# Patient Record
Sex: Male | Born: 1945 | Race: White | Hispanic: No | Marital: Single | Smoking: Never smoker
Health system: Southern US, Community
[De-identification: ages and names within clinical notes are randomized; demographics above are authoritative.]

## PROBLEM LIST (undated history)

## (undated) DIAGNOSIS — R202 Paresthesia of skin: Secondary | ICD-10-CM

## (undated) DIAGNOSIS — M1990 Unspecified osteoarthritis, unspecified site: Secondary | ICD-10-CM

## (undated) DIAGNOSIS — K5900 Constipation, unspecified: Secondary | ICD-10-CM

## (undated) DIAGNOSIS — R200 Anesthesia of skin: Secondary | ICD-10-CM

## (undated) DIAGNOSIS — S069XAA Unspecified intracranial injury with loss of consciousness status unknown, initial encounter: Secondary | ICD-10-CM

## (undated) DIAGNOSIS — K219 Gastro-esophageal reflux disease without esophagitis: Secondary | ICD-10-CM

## (undated) DIAGNOSIS — F32A Depression, unspecified: Secondary | ICD-10-CM

## (undated) DIAGNOSIS — W19XXXA Unspecified fall, initial encounter: Secondary | ICD-10-CM

## (undated) DIAGNOSIS — F329 Major depressive disorder, single episode, unspecified: Secondary | ICD-10-CM

## (undated) DIAGNOSIS — J189 Pneumonia, unspecified organism: Secondary | ICD-10-CM

## (undated) DIAGNOSIS — S069X9A Unspecified intracranial injury with loss of consciousness of unspecified duration, initial encounter: Secondary | ICD-10-CM

## (undated) HISTORY — DX: Major depressive disorder, single episode, unspecified: F32.9

## (undated) HISTORY — DX: Paresthesia of skin: R20.0

## (undated) HISTORY — DX: Depression, unspecified: F32.A

## (undated) HISTORY — DX: Unspecified intracranial injury with loss of consciousness of unspecified duration, initial encounter: S06.9X9A

## (undated) HISTORY — DX: Pneumonia, unspecified organism: J18.9

## (undated) HISTORY — DX: Unspecified osteoarthritis, unspecified site: M19.90

## (undated) HISTORY — DX: Unspecified fall, initial encounter: W19.XXXA

## (undated) HISTORY — DX: Constipation, unspecified: K59.00

## (undated) HISTORY — DX: Gastro-esophageal reflux disease without esophagitis: K21.9

## (undated) HISTORY — PX: TOTAL HIP ARTHROPLASTY: SHX124

## (undated) HISTORY — DX: Unspecified intracranial injury with loss of consciousness status unknown, initial encounter: S06.9XAA

## (undated) HISTORY — DX: Anesthesia of skin: R20.2

---

## 2013-01-07 HISTORY — PX: JOINT REPLACEMENT: SHX530

## 2015-01-08 HISTORY — PX: JOINT REPLACEMENT: SHX530

## 2016-02-29 ENCOUNTER — Other Ambulatory Visit: Payer: Self-pay | Admitting: Orthopedic Surgery

## 2016-02-29 DIAGNOSIS — Z01818 Encounter for other preprocedural examination: Secondary | ICD-10-CM

## 2016-03-04 ENCOUNTER — Ambulatory Visit
Admission: RE | Admit: 2016-03-04 | Discharge: 2016-03-04 | Disposition: A | Discharge status: Home / Self Care | Payer: Medicare Other | Source: Ambulatory Visit | Attending: Orthopedic Surgery | Admitting: Orthopedic Surgery

## 2016-03-04 ENCOUNTER — Other Ambulatory Visit: Payer: Self-pay

## 2016-03-04 DIAGNOSIS — Z01818 Encounter for other preprocedural examination: Secondary | ICD-10-CM

## 2016-04-25 LAB — CBC AND DIFFERENTIAL
HEMATOCRIT: 33 % — AB (ref 41–53)
HEMOGLOBIN: 10.6 g/dL — AB (ref 13.5–17.5)
Platelets: 365 10*3/uL (ref 150–399)
WBC: 11.5 10^3/mL

## 2016-04-25 LAB — BASIC METABOLIC PANEL
BUN: 20 mg/dL (ref 4–21)
Creatinine: 1.2 mg/dL (ref 0.6–1.3)
Glucose: 92 mg/dL
Potassium: 5.4 mmol/L — AB (ref 3.4–5.3)
Sodium: 137 mmol/L (ref 137–147)

## 2016-05-09 LAB — BASIC METABOLIC PANEL: CREATININE: 1.3 mg/dL (ref 0.6–1.3)

## 2016-05-10 LAB — POCT INR: INR: 1.3 — AB (ref 0.9–1.1)

## 2016-05-10 LAB — CBC AND DIFFERENTIAL
HEMATOCRIT: 31 % — AB (ref 41–53)
Hemoglobin: 10.2 g/dL — AB (ref 13.5–17.5)

## 2016-05-11 LAB — CBC AND DIFFERENTIAL
HCT: 28 % — AB (ref 41–53)
Hemoglobin: 9.3 g/dL — AB (ref 13.5–17.5)

## 2016-05-11 LAB — POCT INR: INR: 1.6 — AB (ref 0.9–1.1)

## 2016-05-13 ENCOUNTER — Encounter: Payer: Self-pay | Admitting: Internal Medicine

## 2016-05-13 ENCOUNTER — Non-Acute Institutional Stay (SKILLED_NURSING_FACILITY): Payer: Medicare Other | Admitting: Internal Medicine

## 2016-05-13 DIAGNOSIS — N400 Enlarged prostate without lower urinary tract symptoms: Secondary | ICD-10-CM | POA: Diagnosis not present

## 2016-05-13 DIAGNOSIS — R2681 Unsteadiness on feet: Secondary | ICD-10-CM

## 2016-05-13 DIAGNOSIS — Z96649 Presence of unspecified artificial hip joint: Secondary | ICD-10-CM | POA: Diagnosis not present

## 2016-05-13 DIAGNOSIS — S82142A Displaced bicondylar fracture of left tibia, initial encounter for closed fracture: Secondary | ICD-10-CM | POA: Insufficient documentation

## 2016-05-13 DIAGNOSIS — S82142S Displaced bicondylar fracture of left tibia, sequela: Secondary | ICD-10-CM | POA: Diagnosis not present

## 2016-05-13 DIAGNOSIS — D500 Iron deficiency anemia secondary to blood loss (chronic): Secondary | ICD-10-CM

## 2016-05-13 DIAGNOSIS — K5901 Slow transit constipation: Secondary | ICD-10-CM | POA: Diagnosis not present

## 2016-05-13 DIAGNOSIS — K219 Gastro-esophageal reflux disease without esophagitis: Secondary | ICD-10-CM | POA: Diagnosis not present

## 2016-05-13 DIAGNOSIS — F5101 Primary insomnia: Secondary | ICD-10-CM | POA: Diagnosis not present

## 2016-05-13 NOTE — Progress Notes (Signed)
[facility]  PCP: No primary care provider on file.   Code Status: Full Code  Goals of care: Advanced Directive information Advanced Directives 05/13/2016  Does Patient Have a Medical Advance Directive? Yes  Type of Advance Directive Living will  Does patient want to make changes to medical advance directive? No - Patient declined       Extended Emergency Contact Information Primary Emergency Contact: Patterson,Lisa  [place] Home Phone: [phone] Relation: Daughter   No Known Allergies  Chief Complaint  Patient presents with  . New Admit To SNF    New Admission Visit      HPI:  Patient is a 71 y.o. male seen today for short term rehabilitation post hospital admission from 05/09/16-05/11/16 with left hip pain. 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 He underwent left hip revision surgery. He is seen in his room today. He has PMH of GERD, BPH, seasonal allergies among others.   Review of Systems:  Constitutional: Negative for fever, chills, diaphoresis. Energy level is fair.  HENT: Negative for headache, congestion, nasal discharge, sore throat, difficulty swallowing.   Eyes: Negative for eye pain, blurred vision, double vision and discharge. Wears glasses. Respiratory: Negative for cough, shortness of breath and wheezing.   Cardiovascular: Negative for chest pain, palpitations.  Gastrointestinal: Negative for heartburn, nausea, vomiting, abdominal pain, loss of appetite, melena, diarrhea. Last bowel movement was 2 days ago. At home had a bowel movement on daily basis. Genitourinary: Negative for dysuria Musculoskeletal: Negative for back pain, fall in the facility. Positive for pain to left hip. Pain medication helps to take the edge off.  Skin: Negative for itching, rash.  Neurological: Negative for dizziness. Psychiatric/Behavioral: Negative for depression.   Past  Medical History:  Diagnosis Date  . Acid reflux   . Arthritis   . Brain trauma (HCC)   . Constipation   . Depression   . Fall    Left-tibia fracture, immobilized at present   . Numbness and tingling of hand   . Pneumonia    Past Surgical History:  Procedure Laterality Date  . JOINT REPLACEMENT Right 2015   Hip  . JOINT REPLACEMENT Left 2017   Hip   Social History:   reports that he has never smoked. He has never used smokeless tobacco. He reports that he drinks alcohol. He reports that he does not use drugs.  Family History  Problem Relation Age of Onset  . Cancer Mother     Medications: Allergies as of 05/13/2016   No Known Allergies     Medication List       Accurate as of 05/13/16 12:35 PM. Always use your most recent med list.          doxycycline 100 MG EC tablet Commonly known as:  DORYX Take 100 mg by mouth 2 (two) times daily. Stop date 05/19/16   fluticasone 50 MCG/ACT nasal spray Commonly known as:  FLONASE Place 1 spray into both nostrils daily.   Melatonin 3 MG Tabs Take 6 mg by mouth at bedtime.   OXYCONTIN 10 mg 12 hr tablet Generic drug:  oxyCODONE Take 10 mg by mouth every 12 (twelve) hours.   oxycodone 5 MG capsule Commonly known as:  OXY-IR Take 5-10 mg by mouth every 6 (six) hours as needed.   pantoprazole 40 MG tablet Commonly known as:  PROTONIX Take 40 mg by mouth daily.  tamsulosin 0.4 MG Caps capsule Commonly known as:  FLOMAX Take 0.4 mg by mouth at bedtime.   traMADol 50 MG tablet Commonly known as:  ULTRAM Take 50-100 mg by mouth every 6 (six) hours as needed.   traZODone 50 MG tablet Commonly known as:  DESYREL Take 50 mg by mouth at bedtime.   warfarin 2.5 MG tablet Commonly known as:  COUMADIN Take by mouth daily. Take 1/2 to 2 tablets       Immunizations: Immunization History  Administered Date(s) Administered  . PPD Test 05/12/2016     Physical Exam: Vitals:   05/13/16 1149  BP: 122/71  Pulse: 95    Resp: 20  Temp: 97.1 F (36.2 C)  TempSrc: Oral  SpO2: 94%  Weight: 120 lb (54.4 kg)   General- elderly male, thin built, in no acute distress Head- normocephalic, atraumatic Nose- no nasal discharge Throat- moist mucus membrane, normal oropharynx, poor dentition Eyes- PERRLA, EOMI, no pallor, no icterus, no discharge, normal conjunctiva, normal sclera Neck- no cervical lymphadenopathy Cardiovascular- regular heart rate, no murmur Respiratory- bilateral clear to auscultation, no wheeze, no rhonchi, no crackles, no use of accessory muscles Abdomen- bowel sounds present, soft, non tender, no guarding or rigidity Musculoskeletal- able to move all 4 extremities, limited left hip range of motion, arthritis changes to her fingers, trace left leg edema Neurological- alert and oriented to person, place and time Skin- warm and dry, surgical incision to left hip with dressing and wound vac in place Psychiatry- normal mood and affect    Labs reviewed: Basic Metabolic Panel:  Recent Labs  45/40/98 05/09/16  NA 137  --   K 5.4*  --   BUN 20  --   CREATININE 1.2 1.3   Liver Function Tests: No results for input(s): AST, ALT, ALKPHOS, BILITOT, PROT, ALBUMIN in the last 8760 hours. No results for input(s): LIPASE, AMYLASE in the last 8760 hours. No results for input(s): AMMONIA in the last 8760 hours. CBC:  Recent Labs  04/25/16 05/10/16 05/11/16  WBC 11.5  --   --   HGB 10.6* 10.2* 9.3*  HCT 33* 31* 28*  PLT 365  --   --    Cardiac Enzymes: No results for input(s): CKTOTAL, CKMB, CKMBINDEX, TROPONINI in the last 8760 hours. BNP: Invalid input(s): POCBNP CBG: No results for input(s): GLUCAP in the last 8760 hours.  Radiological Exams: XR Pelvis 1-2 Views 05/09/16 Impression. Postoperative from revision total left hip arthoplasty. The tip of one of the acetabular screws extended beyond the film. Another acetabular screw extends through the bone pelvis. Protrusio acetabuli. Right  hip prosthesis also noted.  XR hip Min 2 Views Left 05/09/16 Impression. Ongoing placement of a left hip arthoplasty. Prominent left hip protrusio acetabula noted. Femoral neck component is directed towards the acetabulum but definite articulation has not yet occurred no evidence of hardware failure or fracture. Pre- existing right hip arthoplasty noted.  Assessment/Plan  unsteady gait Will have patient work with PT/OT as tolerated to regain strength and restore function.  Fall precautions are in place.  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 Now s/p revision. Will need orthopedic follow up. Continue TDWB for 4 weeks. Continue doxycycline 100 mg q12h until 05/19/16 for infection prophylaxis. Will have him work with physical therapy and occupational therapy team to help with gait training and muscle strengthening exercises.fall precautions. Skin care. Encourage to be out of bed. Continue ocyxontin 10 mg bid and  oxyIR 5 mg 1-2 tab q6h prn pain. Continue tramadol 50 mg 1-2 tab q6h prn pain. Continue coumadin for DVT prophylaxis. Monitor INR  Blood loss anemia Post op, monitor cbc  Left tibia fracture Has minimally displaced left tibia plateau fracture. ROM as tolerated per ortho. Had immobilizer in place before. Fall precautions. Will have patient work with PT/OT as tolerated to regain strength and restore function.  Fall precautions are in place.  Slow transit constipation Start senna s 2 tab qhs and miralax daily x 2 days, then miralax daily as needed and monitor  Insomnia Continue melatonin and trazodone  BPH c/w flomax  gerd Continue protonix 40 mg daily and monitor.    Goals of care: short term rehabilitation   Labs/tests ordered: cbc, bmp, inr  Family/ staff Communication: reviewed care plan with patient and nursing supervisor  I have spent greater than 50 minutes for this encounter which includes reviewing  hospital records, addressing above mentioned concerns, reviewing care plan with patient, answering patient's concerns and counseling.    Oneal Grout, MD Internal Medicine [facility] [address] [place], [place] [address] Cell Phone (Monday-Friday 8 am - 5 pm): [phone] On Call: [phone] and follow prompts after 5 pm and on weekends Office Phone: [phone] Office Fax: [phone]

## 2016-05-29 ENCOUNTER — Encounter: Payer: Self-pay | Admitting: Family

## 2016-05-29 ENCOUNTER — Non-Acute Institutional Stay (SKILLED_NURSING_FACILITY): Payer: Medicare Other | Admitting: Family

## 2016-05-29 DIAGNOSIS — R2681 Unsteadiness on feet: Secondary | ICD-10-CM | POA: Diagnosis not present

## 2016-05-29 DIAGNOSIS — N400 Enlarged prostate without lower urinary tract symptoms: Secondary | ICD-10-CM | POA: Diagnosis not present

## 2016-05-29 DIAGNOSIS — G4700 Insomnia, unspecified: Secondary | ICD-10-CM | POA: Diagnosis not present

## 2016-05-29 DIAGNOSIS — Z96649 Presence of unspecified artificial hip joint: Secondary | ICD-10-CM | POA: Diagnosis not present

## 2016-05-29 DIAGNOSIS — K219 Gastro-esophageal reflux disease without esophagitis: Secondary | ICD-10-CM | POA: Diagnosis not present

## 2016-05-29 DIAGNOSIS — K5901 Slow transit constipation: Secondary | ICD-10-CM

## 2016-05-29 NOTE — Progress Notes (Signed)
Location:  [facility] Room Number: 1202P Place of Service:  SNF (31)  Provider: Richarda Blade FNP-C   PCP: No primary care provider on file. No care team member to display  Extended Emergency Contact Information Primary Emergency Contact: Ina [place] Home Phone: [phone] Relation: Daughter  Code Status: Full code  Goals of care:  Advanced Directive information Advanced Directives 05/29/2016  Does Patient Have a Medical Advance Directive? No  Type of Advance Directive -  Does patient want to make changes to medical advance directive? -     No Known Allergies  Chief Complaint  Patient presents with  . Discharge Note    HPI:  71 y.o. male seen today at [facility] for discharge home.He was here for short term rehabilitation for post hospital admission from 05/09/2016-05/11/2016 with left hip pain. 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 He underwent left hip revision surgery.He has a medical history of BPH, seasonal allergies,GERD,Insomnia among other conditions.He is seen in his room today.He denies any acute issues this visit.He has worked with PT/OT now stable for discharge home.He will be discharged home with Home health PT/OT to continue with ROM, Exercise, Gait stability and muscle strengthening.He will also need a HHRN for INR check;Recent INR was 2.0 (05/21/2016) next INR due next 06/03/2016.He does not require any DME he states has own FWW and wheelchair at home.Home health services will be arranged by facility social worker prior to discharge. Prescription medication will be written x 1 month then patient to follow up with PCP in 1-2 weeks.Facility staff report no new concerns.     Past Medical History:  Diagnosis Date  . Acid reflux   . Arthritis   . Brain trauma (HCC)   . Constipation   . Depression   . Fall    Left-tibia fracture, immobilized at present   . Numbness and tingling of hand   . Pneumonia     Past Surgical History:  Procedure Laterality Date  . JOINT REPLACEMENT Right 2015   Hip  . JOINT REPLACEMENT Left 2017   Hip      reports that he has never smoked. He has never used smokeless tobacco. He reports that he drinks alcohol. He reports that he does not use drugs. Social History   Social History  . Marital status: Single    Spouse name: N/A  . Number of children: N/A  . Years of education: N/A   Occupational History  . Not on file.   Social History Main Topics  . Smoking status: Never Smoker  . Smokeless tobacco: Never Used  . Alcohol use Yes  . Drug use: No  . Sexual activity: Not on file   Other Topics Concern  . Not on file   Social History Narrative  . No narrative on file   No Known Allergies  Pertinent  Health Maintenance Due  Topic Date Due  . COLONOSCOPY  03/24/1995  . PNA vac Low Risk Adult (1 of 2 - PCV13) 03/24/2010  . INFLUENZA VACCINE  08/07/2016    Medications: Allergies as of 05/29/2016   No Known Allergies     Medication List       Accurate as of 05/29/16  1:16 PM. Always use your most recent med list.          fluticasone 50 MCG/ACT nasal spray Commonly known as:  FLONASE Place 1  spray into both nostrils daily.   Melatonin 3 MG Tabs Take 6 mg by mouth at bedtime.   OXYCONTIN 10 mg 12 hr tablet Generic drug:  oxyCODONE Take 10 mg by mouth every 12 (twelve) hours.   oxycodone 5 MG capsule Commonly known as:  OXY-IR Take 5-10 mg by mouth every 6 (six) hours as needed.   pantoprazole 40 MG tablet Commonly known as:  PROTONIX Take 40 mg by mouth daily.   polyethylene glycol packet Commonly known as:  MIRALAX / GLYCOLAX Take 17 g by mouth daily as needed.   sennosides-docusate sodium 8.6-50 MG tablet Commonly known as:  SENOKOT-S Take 2 tablets by mouth at bedtime.   tamsulosin 0.4 MG Caps capsule Commonly known as:   FLOMAX Take 0.4 mg by mouth at bedtime.   traMADol 50 MG tablet Commonly known as:  ULTRAM Take 50-100 mg by mouth every 6 (six) hours as needed.   traZODone 50 MG tablet Commonly known as:  DESYREL Take 50 mg by mouth at bedtime.   UNABLE TO FIND Take 90 mLs by mouth 2 (two) times daily. Med Name: Med Pass 2.0   warfarin 2.5 MG tablet Commonly known as:  COUMADIN Take by mouth daily. Take 1/2 to 2 tablets       Review of Systems  Constitutional: Negative for activity change, appetite change, chills, fatigue and fever.  HENT: Negative for congestion, rhinorrhea, sinus pain, sinus pressure, sneezing, sore throat and trouble swallowing.   Eyes: Negative.   Respiratory: Negative for cough, chest tightness, shortness of breath and wheezing.   Cardiovascular: Positive for leg swelling. Negative for chest pain and palpitations.  Gastrointestinal: Negative for abdominal distention, abdominal pain, constipation, diarrhea, nausea and vomiting.  Endocrine: Negative.   Genitourinary: Negative for dysuria, flank pain, frequency and urgency.  Musculoskeletal: Positive for gait problem.       Left hip pain under control with current medication   Skin: Negative for color change, pallor and rash.  Neurological: Negative for dizziness, seizures, syncope, light-headedness and headaches.  Hematological: Does not bruise/bleed easily.  Psychiatric/Behavioral: Negative for agitation, confusion, hallucinations and sleep disturbance. The patient is not nervous/anxious.     Vitals:   05/29/16 0935  BP: 122/71  Pulse: 95  Resp: 20  Temp: 97.7 F (36.5 C)  SpO2: 95%  Weight: 97 lb 3.2 oz (44.1 kg)   There is no height or weight on file to calculate BMI. Physical Exam  Constitutional: He is oriented to person, place, and time. He appears well-developed and well-nourished. No distress.  HENT:  Head: Normocephalic.  Mouth/Throat: Oropharynx is clear and moist. No oropharyngeal exudate.  Eyes:  Conjunctivae and EOM are normal. Pupils are equal, round, and reactive to light. Right eye exhibits no discharge. Left eye exhibits no discharge. No scleral icterus.  Neck: Normal range of motion. No JVD present. No thyromegaly present.  Cardiovascular: Normal rate, regular rhythm, normal heart sounds and intact distal pulses.  Exam reveals no gallop and no friction rub.   No murmur heard. Pulmonary/Chest: Effort normal and breath sounds normal. No respiratory distress. He has no wheezes. He has no rales.  Abdominal: Soft. Bowel sounds are normal. He exhibits no distension. There is no tenderness. There is no rebound and no guarding.  Genitourinary:  Genitourinary Comments: Continent   Musculoskeletal: He exhibits no tenderness or deformity.  Unsteady gait. Bilateral trace-1+ edema to lower extremities.   Lymphadenopathy:    He has no cervical adenopathy.  Neurological: He is  oriented to person, place, and time.  Skin: Skin is warm and dry. No rash noted. No erythema. No pallor.  Left hip surgical incision dry, clean and intact. Surrounding skin without any signs of infections.   Psychiatric: He has a normal mood and affect.    Labs reviewed: Basic Metabolic Panel:  Recent Labs  78/29/56 05/09/16  NA 137  --   K 5.4*  --   BUN 20  --   CREATININE 1.2 1.3   CBC:  Recent Labs  04/25/16 05/10/16 05/11/16  WBC 11.5  --   --   HGB 10.6* 10.2* 9.3*  HCT 33* 31* 28*  PLT 365  --   --    Assessment/Plan:   1. Unsteady gait Has worked well with PT/ OT. Will discharge home PT/OT to continue with ROM, Exercise, Gait stability and muscle strengthening. No DME required he states has own FWW and wheelchair at home. Fall and safety precautions.   2. Benign prostatic hyperplasia without lower urinary tract symptoms Continue on Tamsulosin 0.4 mg at bedtime.   3. Gastroesophageal reflux disease without esophagitis Stable.Continue on Protonix.    4. Slow transit constipation Continue on  Senokot-S 8.6-50 mg 2 tabs QHS and Miralax 17 Gm daily as needed.   5. S/P revision of total hip Status post arthroplasty infection post recent revision. He completed doxycycline 5/01/10/2016 for prophylaxis. Currently on warfarin 6 mg Tablet for DVT prophylaxis. Recent INR was 2.0 (05/21/2016) next INR due 06/03/2016. Will need a HHRN to check and monitor INR. Has worked well with PT/OT.discharge with [facility] PT/OT for ROM, exercise, gait stability and muscle strengthening. Continue on Oxycontin 10 mg 12 HR Tablet, Oxycodone IR 5 mg Tablet 1-2 Tablet every 6 Hours as needed and Tramadol 50 mg 1-2 Tablet every 6 hours as need for pain.continue to follow up with Ortho as directed. Wean off Narcotics as tolerated.    6. Insomnia Continue melatonin 3 mg tablet at bedtime.   Patient is being discharged with the following home health services:   -PT/OT for ROM, exercise, gait stability and muscle strengthening -  HH RN for INR check management   Patient is being discharged with the following durable medical equipment:   - None required he states has own FWW and wheelchair at home.  Patient has been advised to f/u with their PCP in 1-2 weeks to for a transitions of care visit.Social services at their facility was responsible for arranging this appointment.  Pt was provided with adequate prescriptions of noncontrolled medications to reach the scheduled appointment.For controlled substances, a limited supply was provided as appropriate for the individual patient. If the pt normally receives these medications from a pain clinic or has a contract with another physician, these medications should be received from that clinic or physician only).    Future labs/tests needed:  CBC, BMP in 1-2 weeks PCP

## 2016-06-05 ENCOUNTER — Emergency Department (HOSPITAL_COMMUNITY): Payer: Medicare Other

## 2016-06-05 ENCOUNTER — Encounter (HOSPITAL_COMMUNITY): Payer: Self-pay | Admitting: Emergency Medicine

## 2016-06-05 ENCOUNTER — Emergency Department (HOSPITAL_COMMUNITY)
Admission: EM | Admit: 2016-06-05 | Discharge: 2016-06-06 | Disposition: A | Discharge status: Other Acute Inpatient Hospital | Payer: Medicare Other | Attending: Emergency Medicine | Admitting: Emergency Medicine

## 2016-06-05 DIAGNOSIS — Z96643 Presence of artificial hip joint, bilateral: Secondary | ICD-10-CM | POA: Diagnosis not present

## 2016-06-05 DIAGNOSIS — Z7901 Long term (current) use of anticoagulants: Secondary | ICD-10-CM | POA: Diagnosis not present

## 2016-06-05 DIAGNOSIS — A419 Sepsis, unspecified organism: Secondary | ICD-10-CM | POA: Diagnosis not present

## 2016-06-05 DIAGNOSIS — M009 Pyogenic arthritis, unspecified: Secondary | ICD-10-CM | POA: Insufficient documentation

## 2016-06-05 DIAGNOSIS — Z79899 Other long term (current) drug therapy: Secondary | ICD-10-CM | POA: Diagnosis not present

## 2016-06-05 LAB — CBC WITH DIFFERENTIAL/PLATELET
BASOS ABS: 0 10*3/uL (ref 0.0–0.1)
Basophils Relative: 0 %
EOS ABS: 0 10*3/uL (ref 0.0–0.7)
EOS PCT: 0 %
HCT: 29.2 % — ABNORMAL LOW (ref 39.0–52.0)
Hemoglobin: 9.4 g/dL — ABNORMAL LOW (ref 13.0–17.0)
Lymphocytes Relative: 4 %
Lymphs Abs: 0.9 10*3/uL (ref 0.7–4.0)
MCH: 29.2 pg (ref 26.0–34.0)
MCHC: 32.2 g/dL (ref 30.0–36.0)
MCV: 90.7 fL (ref 78.0–100.0)
Monocytes Absolute: 1.9 10*3/uL — ABNORMAL HIGH (ref 0.1–1.0)
Monocytes Relative: 8 %
Neutro Abs: 21.4 10*3/uL — ABNORMAL HIGH (ref 1.7–7.7)
Neutrophils Relative %: 88 %
Platelets: 285 10*3/uL (ref 150–400)
RBC: 3.22 MIL/uL — AB (ref 4.22–5.81)
RDW: 13.6 % (ref 11.5–15.5)
WBC: 24.3 10*3/uL — AB (ref 4.0–10.5)

## 2016-06-05 LAB — COMPREHENSIVE METABOLIC PANEL
ALT: 8 U/L — AB (ref 17–63)
AST: 20 U/L (ref 15–41)
Albumin: 2.8 g/dL — ABNORMAL LOW (ref 3.5–5.0)
Alkaline Phosphatase: 82 U/L (ref 38–126)
Anion gap: 9 (ref 5–15)
BILIRUBIN TOTAL: 0.5 mg/dL (ref 0.3–1.2)
BUN: 11 mg/dL (ref 6–20)
CALCIUM: 7.7 mg/dL — AB (ref 8.9–10.3)
CO2: 17 mmol/L — ABNORMAL LOW (ref 22–32)
CREATININE: 1.37 mg/dL — AB (ref 0.61–1.24)
Chloride: 108 mmol/L (ref 101–111)
GFR calc Af Amer: 58 mL/min — ABNORMAL LOW (ref >60)
GFR, EST NON AFRICAN AMERICAN: 50 mL/min — AB (ref >60)
Glucose, Bld: 120 mg/dL — ABNORMAL HIGH (ref 65–99)
Potassium: 3.9 mmol/L (ref 3.5–5.1)
Sodium: 134 mmol/L — ABNORMAL LOW (ref 135–145)
TOTAL PROTEIN: 5.5 g/dL — AB (ref 6.5–8.1)

## 2016-06-05 LAB — URINALYSIS, ROUTINE W REFLEX MICROSCOPIC
Bilirubin Urine: NEGATIVE
Glucose, UA: NEGATIVE mg/dL
HGB URINE DIPSTICK: NEGATIVE
KETONES UR: NEGATIVE mg/dL
LEUKOCYTES UA: NEGATIVE
Nitrite: NEGATIVE
PROTEIN: NEGATIVE mg/dL
Specific Gravity, Urine: 1.016 (ref 1.005–1.030)
pH: 5 (ref 5.0–8.0)

## 2016-06-05 LAB — I-STAT CG4 LACTIC ACID, ED
LACTIC ACID, VENOUS: 2.37 mmol/L — AB (ref 0.5–1.9)
Lactic Acid, Venous: 0.78 mmol/L (ref 0.5–1.9)

## 2016-06-05 LAB — LACTIC ACID, PLASMA: LACTIC ACID, VENOUS: 0.8 mmol/L (ref 0.5–1.9)

## 2016-06-05 LAB — PROTIME-INR
INR: 3.07
PROTHROMBIN TIME: 32.4 s — AB (ref 11.4–15.2)

## 2016-06-05 MED ORDER — VANCOMYCIN HCL IN DEXTROSE 1-5 GM/200ML-% IV SOLN: 1000.0000 mg | Freq: Once | INTRAVENOUS | Status: DC

## 2016-06-05 MED ORDER — VANCOMYCIN HCL IN DEXTROSE 750-5 MG/150ML-% IV SOLN: 750.0000 mg | INTRAVENOUS | Status: DC

## 2016-06-05 MED ORDER — SODIUM CHLORIDE 0.9 % IV SOLN
1250.0000 mg | Freq: Once | INTRAVENOUS | Status: AC
  Administered 2016-06-05: 1250 mg via INTRAVENOUS
  Filled 2016-06-05: qty 1250

## 2016-06-05 MED ORDER — PIPERACILLIN-TAZOBACTAM 3.375 G IVPB
3.3750 g | Freq: Three times a day (TID) | INTRAVENOUS | Status: DC
  Administered 2016-06-05: 3.375 g via INTRAVENOUS
  Filled 2016-06-05: qty 50

## 2016-06-05 MED ORDER — FENTANYL CITRATE (PF) 100 MCG/2ML IJ SOLN
50.0000 ug | Freq: Once | INTRAMUSCULAR | Status: AC
  Administered 2016-06-05: 50 ug via INTRAVENOUS
  Filled 2016-06-05: qty 2

## 2016-06-05 MED ORDER — SODIUM CHLORIDE 0.9 % IV SOLN
1000.0000 mL | INTRAVENOUS | Status: DC
  Administered 2016-06-05: 1000 mL via INTRAVENOUS

## 2016-06-05 MED ORDER — PIPERACILLIN-TAZOBACTAM 3.375 G IVPB 30 MIN
3.3750 g | Freq: Once | INTRAVENOUS | Status: AC
  Administered 2016-06-05: 3.375 g via INTRAVENOUS
  Filled 2016-06-05: qty 50

## 2016-06-05 NOTE — ED Notes (Signed)
[facility]  Hospitalist to admit.

## 2016-06-05 NOTE — ED Notes (Signed)
Lactic acid result given to Dr. Tegeler 

## 2016-06-05 NOTE — Progress Notes (Signed)
Pharmacy Antibiotic Note  Chad SewerRichard Parks is a 71 y.o. male admitted on 06/05/2016 with sepsis.  Pharmacy has been consulted for vancomycin and Zosyn dosing. Patient is afebrile, WBC 24.3, CrCl ~238ml/min  Plan: Vancomycin 1250mg  x1 then 750mg  IV every 24 hours.  Goal trough 15-20 mcg/mL. Zosyn 3.375g IV q8h (4 hour infusion).  Monitor renal function, cultures, vanc trough at steady state  Height: 5\' 8"  (172.7 cm) Weight: 120 lb (54.4 kg) IBW/kg (Calculated) : 68.4  Temp (24hrs), Avg:98.4 F (36.9 C), Min:98.4 F (36.9 C), Max:98.4 F (36.9 C)  No results for input(s): WBC, CREATININE, LATICACIDVEN, VANCOTROUGH, VANCOPEAK, VANCORANDOM, GENTTROUGH, GENTPEAK, GENTRANDOM, TOBRATROUGH, TOBRAPEAK, TOBRARND, AMIKACINPEAK, AMIKACINTROU, AMIKACIN in the last 168 hours.  CrCl cannot be calculated (Patient's most recent lab result is older than the maximum 21 days allowed.).    No Known Allergies  Antimicrobials this admission: 5/30 vanc >>  5/30 Zosyn >>   Dose adjustments this admission: n/a  Microbiology results: 5/30 BCx:    Thank you for allowing pharmacy to be a part of this patient's care.  Chad Parks, PharmD PGY1 Pharmacy Resident Rx ED [phone]#[id number] 06/05/2016 5:11 PM

## 2016-06-05 NOTE — ED Notes (Signed)
Family medicine at bedside.

## 2016-06-05 NOTE — ED Notes (Signed)
Pt's labs and blood cultures were drawn prior to antibiotic (Zosyn) being started even though labels for blood cultures were printed at 17:11

## 2016-06-05 NOTE — ED Notes (Signed)
Pt returned from x-ray  Daughter at bedside

## 2016-06-05 NOTE — ED Notes (Signed)
Patient transported to X-ray 

## 2016-06-05 NOTE — ED Provider Notes (Signed)
[facility]-EMERGENCY DEPT Provider Note   CSN: [id number] Arrival date & time: 06/05/16  1630     History   Chief Complaint Chief Complaint  Patient presents with  . Fever    HPI Chad Parks is a 71 y.o. male.  HPI Chad Parks is a 71 y.o. male with history of recent left total hip revision on 05/09/16 performed at [facility] by Dr.Fehring, presents to emergency department complaining of fever. Patient states that since discharge he has been in rehabilitation and currently is living with his daughter. He is nonweightbearing in the left hip and requires assistance. Patient reports history of original total hip replacement which was complicated by an infection for which she has been treated for with antibiotics and the hip spacer. Patient states he has been followed by infectious disease and was told that infection has resolved. This was followed by the revision on May 3. Patient has been doing well since then. He states he started having chills last night and started having fever despite morning. He does report increased pain in that hip as well. He denies any obvious swelling to the hip or drainage from the incision. He denies any headache or neck pain or stiffness. No URI symptoms. No cough or congestion. No abdominal pain. No back pain. No urinary symptoms. Pt received 2L of IV fluids by EMS and 1g of tylenol.   Past Medical History:  Diagnosis Date  . Acid reflux   . Arthritis   . Brain trauma (HCC)   . Constipation   . Depression   . Fall    Left-tibia fracture, immobilized at present   . Numbness and tingling of hand   . Pneumonia     Patient Active Problem List   Diagnosis Date Noted  . 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 Closed fracture of left tibial plateau 05/13/2016  . Primary insomnia 05/13/2016  . Benign prostatic hyperplasia 05/13/2016  . Gastroesophageal reflux disease without esophagitis 05/13/2016    Past Surgical History:  Procedure Laterality Date  .  JOINT REPLACEMENT Right 2015   Hip  . JOINT REPLACEMENT Left 2017   Hip  . TOTAL HIP ARTHROPLASTY         Home Medications    Prior to Admission medications   Medication Sig Start Date End Date Taking? Authorizing Provider  fluticasone (FLONASE) 50 MCG/ACT nasal spray Place 1 spray into both nostrils daily.    [provider]  Melatonin 3 MG TABS Take 6 mg by mouth at bedtime.    [provider]  oxycodone (OXY-IR) 5 MG capsule Take 5-10 mg by mouth every 6 (six) hours as needed.    [provider]  oxyCODONE (OXYCONTIN) 10 mg 12 hr tablet Take 10 mg by mouth every 12 (twelve) hours.    [provider]  pantoprazole (PROTONIX) 40 MG tablet Take 40 mg by mouth daily.    [provider]  polyethylene glycol (MIRALAX / GLYCOLAX) packet Take 17 g by mouth daily as needed.    [provider]  sennosides-docusate sodium (SENOKOT-S) 8.6-50 MG tablet Take 2 tablets by mouth at bedtime.    [provider]  tamsulosin (FLOMAX) 0.4 MG CAPS capsule Take 0.4 mg by mouth at bedtime.    [provider]  traMADol (ULTRAM) 50 MG tablet Take 50-100 mg by mouth every 6 (six) hours as needed.    [provider]  traZODone (DESYREL) 50 MG tablet Take 50 mg by mouth at bedtime.  [provider]  UNABLE TO FIND Take 90 mLs by mouth 2 (two) times daily. Med Name: Med Pass 2.0    [provider]  warfarin (COUMADIN) 2.5 MG tablet Take by mouth daily. Take 1/2 to 2 tablets    [provider]    Family History Family History  Problem Relation Age of Onset  . Cancer Mother     Social History Social History  Substance Use Topics  . Smoking status: Never Smoker  . Smokeless tobacco: Never Used  . Alcohol use Yes     Allergies   Patient has no known allergies.   Review of Systems Review of Systems  Constitutional: Positive for chills and fever.  Respiratory: Negative for cough, chest  tightness and shortness of breath.   Cardiovascular: Negative for chest pain, palpitations and leg swelling.  Gastrointestinal: Negative for abdominal distention, abdominal pain, diarrhea, nausea and vomiting.  Genitourinary: Negative for dysuria, frequency, hematuria and urgency.  Musculoskeletal: Positive for arthralgias. Negative for myalgias, neck pain and neck stiffness.  Skin: Negative for rash.  Allergic/Immunologic: Negative for immunocompromised state.  Neurological: Negative for dizziness, weakness, light-headedness, numbness and headaches.  All other systems reviewed and are negative.    Physical Exam Updated Vital Signs BP 106/66 (BP Location: Right Arm)   Pulse (!) 113   Temp 98.4 F (36.9 C) (Oral)   Resp 16   Ht 5\' 8"  (1.727 m)   Wt 54.4 kg (120 lb)   SpO2 96%   BMI 18.25 kg/m   Physical Exam  Constitutional: He is oriented to person, place, and time. He appears well-developed and well-nourished. No distress.  HENT:  Head: Normocephalic and atraumatic.  Eyes: Conjunctivae are normal.  Neck: Neck supple.  Cardiovascular: Normal rate, regular rhythm and normal heart sounds.   Pulmonary/Chest: Effort normal. No respiratory distress. He has no wheezes. He has no rales.  Abdominal: Soft. Bowel sounds are normal. He exhibits no distension. There is no tenderness. There is no rebound.  Musculoskeletal: He exhibits no edema.  Patient's left leg is internally rotated at the hip. He has diffuse tenderness to the hip joint. I do not see any erythema or obvious swelling to the hip. Incision is healed well. Pain with any range of motion at the hip joint. 1+ lower extremity edema in the left leg only. After nontender. Normal knee and ankle.  Neurological: He is alert and oriented to person, place, and time.  Skin: Skin is warm and dry.  Nursing note and vitals reviewed.    ED Treatments / Results  Labs (all labs ordered are listed, but only abnormal results are  displayed) Labs Reviewed  COMPREHENSIVE METABOLIC PANEL - Abnormal; Notable for the following:       Result Value   Sodium 134 (*)    CO2 17 (*)    Glucose, Bld 120 (*)    Creatinine, Ser 1.37 (*)    Calcium 7.7 (*)    Total Protein 5.5 (*)    Albumin 2.8 (*)    ALT 8 (*)    GFR calc non Af Amer 50 (*)    GFR calc Af Amer 58 (*)    All other components within normal limits  CBC WITH DIFFERENTIAL/PLATELET - Abnormal; Notable for the following:    WBC 24.3 (*)    RBC 3.22 (*)    Hemoglobin 9.4 (*)    HCT 29.2 (*)    Neutro Abs 21.4 (*)    Monocytes Absolute 1.9 (*)  All other components within normal limits  URINALYSIS, ROUTINE W REFLEX MICROSCOPIC - Abnormal; Notable for the following:    APPearance HAZY (*)    All other components within normal limits  PROTIME-INR - Abnormal; Notable for the following:    Prothrombin Time 32.4 (*)    All other components within normal limits  I-STAT CG4 LACTIC ACID, ED - Abnormal; Notable for the following:    Lactic Acid, Venous 2.37 (*)    All other components within normal limits  CULTURE, BLOOD (ROUTINE X 2)  CULTURE, BLOOD (ROUTINE X 2)  URINE CULTURE  LACTIC ACID, PLASMA  I-STAT CG4 LACTIC ACID, ED    EKG  EKG Interpretation  Date/Time:  Wednesday Jun 05 2016 16:31:35 EDT Ventricular Rate:  114 PR Interval:    QRS Duration: 117 QT Interval:  319 QTC Calculation: 440 R Axis:   -29 Text Interpretation:  Sinus tachycardia Right bundle branch block no prior ECG for comparison.  No STEMI Confirmed by Theda Belfastegeler, Chris ([id number]) on 06/05/2016 4:37:23 PM       Radiology Dg Chest 2 View  Result Date: 06/05/2016 CLINICAL DATA:  Sepsis. Recent total left hip arthroplasty. Fever last evening. EXAM: CHEST  2 VIEW COMPARISON:  None. FINDINGS: There is a moderate-sized hiatal hernia projecting over the top normal size cardiac silhouette. There is mild aortic atherosclerosis. 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 No pulmonary consolidation Old posttraumatic deformity with healing of both humeral necks. IMPRESSION: 1. No pneumonic consolidation. 2. COPD. 3. Moderate-sized hiatal hernia. 4. Aortic atherosclerosis. 5. Bilateral healed fracture deformity of both humeral necks. Electronically Signed   By: Tollie Ethavid  Kwon M.D.   On: 06/05/2016 18:03   Dg Hip Unilat W Or Wo Pelvis 2-3 Views Left  Result Date: 06/05/2016 CLINICAL DATA:  Left hip pain, sepsis and fever. History of recent total hip arthroplasty. EXAM: DG HIP (WITH OR WITHOUT PELVIS) 2-3V LEFT COMPARISON:  None. FINDINGS: Partially visualized right bipolar hip arthroplasty with uncemented femoral stem. The tip is excluded on this study. There is a left total hip arthroplasty with uncemented femoral stem. Mild protrusio of the left acetabular component is seen. No loosening is identified. Three cerclage wires are noted about the left femoral shaft and about the femoral component. There soft tissue prominence about the left hip for which a joint effusion is not entirely excluded. Heterotopic bone formation is seen about the left hip from prior arthroplasty. IMPRESSION: Bilateral hip arthroplasties as above described. Soft tissue prominence about the left hip cannot exclude a joint effusion. Consider joint aspiration to exclude a septic joint. No apparent hardware failure or loosening. No postoperative fracture. Electronically Signed   By: Tollie Ethavid  Kwon M.D.   On: 06/05/2016 18:09    Procedures Procedures (including critical care time)  Medications Ordered in ED Medications  0.9 %  sodium chloride infusion (not administered)  piperacillin-tazobactam (ZOSYN) IVPB 3.375 g (not administered)     Initial Impression / Assessment and Plan / ED Course  I have reviewed the triage vital signs and the nursing notes.  Pertinent labs & imaging results that were available during my care of the patient were reviewed by me and considered in my medical decision  making (see chart for details).     4:51 PM Patient seen and examined, patient is tachycardic, febrile at 101.8, already received 2 L of fluids by EMS and 1 g of Tylenol. Will activate code sepsis given his vital signs. Patient weighs 54 kg,  and he also received 2 L of fluids, so will start her maintenance. Labs ordered.  Sepsis - Repeat Assessment  Performed at:    6:17 PM   Vitals     Blood pressure (!) 87/53, pulse 97, temperature (!) 101.1 F (38.4 C), temperature source Rectal, resp. rate 17, height 5\' 8"  (1.727 m), weight 54.4 kg (120 lb), SpO2 97 %.  Heart:     Regular rate and rhythm  Lungs:    CTA  Capillary Refill:   <2 sec  Peripheral Pulse:   Radial pulse palpable  Skin:     Normal Color  6:17 PM Daughter at bed side. Explained plan and that some blood work still pending. Will continue monitor BPs closely.     7:18 PM Spoke with Dr. Theodis Aguas, with [facility] who is on Call for Dr. Barrie Lyme. Advised that pt needs to be transferred to [facility] once more stable to their medicine team. At this time, pt has had 2L of saline, on maintenance fluids, BP in 90s systolic. Agreed with Dr. Theodis Aguas that he would need to be resuscitated more prior to transfer. He also advised continue IV antibiotics for now.   9:00 PM Patient admitted to family practice, for further treatment in order to stabilize and then transferred to [facility].   Admitting team at bedside, patient's blood pressures have been more stable. His repeat lactic acid is 0.8. Urine with no evidence of infection. He is feeling better. We will try transfer at this time, I do think patient is stable for transfer to [facility].  12:58 AM Delay and speaking with a hospitalist at [facility], we have paged him several times with no response. Finally Spoke with Dr. Lavonna Rua, who accepted patient for transfer. Once a bed is ready, we will get care link to take patient to [facility]. Pressures are stable.   Vitals:    06/05/16 2300 06/05/16 2330 06/05/16 2345 06/06/16 0000  BP: 103/60 112/66 117/70 111/61  Pulse: 96 98 100 100  Resp: (!) 21 (!) 21 (!) 21 20  Temp:      TempSrc:      SpO2: 99% 97% 93% 95%  Weight:      Height:           Final Clinical Impressions(s) / ED Diagnoses   Final diagnoses:  Sepsis, due to unspecified organism (HCC)  Pyogenic arthritis of left hip, due to unspecified organism [facility])    New Prescriptions New Prescriptions   No medications on file     Jaynie Crumble, PA-C 06/06/16 1940    Tegeler, Canary Brim, MD 06/08/16 0120

## 2016-06-05 NOTE — ED Triage Notes (Signed)
Pt to ED via [facility] with c/o elevated temp at home.  Pt was recently discharged home from [facility] were he was for rehab after having a left hip replacement.  Pt developed an infection at the surgical site and was given antibiotics for same.  Pt's daughter st's pt started having elevated temp last pm with increased pain to left hip.  Enroute to ED EMS gave pt Tylenol 1000mg  PO and 2 liters of NS.  On arrival to ED pt alert and oriented x's 3.

## 2016-06-05 NOTE — ED Notes (Signed)
Pt given turkey sandwich and coke 

## 2016-06-05 NOTE — Progress Notes (Signed)
FPTS Interim Progress Note  S: Called for admission for this pleasant 71 yo male with sepsis likely 2/2 septic L hip joint s/p joint replacement 06/2015. [facility]. EDP called [facility] who recommended admission for stabilization prior to transfer for orthopedic care. ED Code Sepsis initiated. Patient from SNF - reports that his normal BP is 110s/60s and he is thinking normally. Denies any other infectious symptoms other than L hip pain and fever starting last night. Denies cough, SOB, CP, abd pain, changes in urination or BMs, or other pain. Notes that L hip pain is much worse than baseline. Daughter   O: BP 107/69   Pulse 100   Temp 98.4 F (36.9 C) (Oral)   Resp (!) 22   Ht 5\' 8"  (1.727 m)   Wt 120 lb (54.4 kg)   SpO2 97%   BMI 18.25 kg/m   Gen: thin elderly male sitting up in bed in NAD.  CV: RRR, no murmur Resp: CTAB, easy WOB on RA Abd: SNTND, +BS Extremities: L hip warmer than surrounding tissue, incision C/D/I, TTP, internally rotated, minimal ROM. BLE no edema, warm  A/P: Sepsis: WBC 24.3. Hypotensive to 84/57, tachycardic to 110s. Lactic acid 2.37>0.78. Code sepsis inititated by ED - patient received 2L NS and 1g tylenol in EMS truck, then 1L NS and MIVF with NS in our ED. Started on vanc/zosyn. CXR without obvious infection, L hip tender with joint effusion, UA without nitrites or leukocytes. C/w possible septic joint. EDP discussed case with on call ortho at [facility] who requested transfer after stabilization of vital signs. Patient reports BP at home when well is 110/60. BP on our exam 92/60 and patient mentating normally. HR and lactic acid responded well to fluid resuscitation. Patient does not need pressors. [facility] now as the ultimate resolution of his illness will likely be irrigation of septic joint by ortho team.  -recommend [facility] to [facility] at [facility]  Seen with upper level resident Dr. Jonathon JordanGambino, above plans  discussed with EDP Kirkchenko and Dr. Julieanne Mansonegler, who will [facility] medicine for admission. Appreciate the excellent care of EDPs.   Chad Parks, Chad Vancuren, MD 06/05/2016, 10:31 PM PGY-1, [facility] pager [phone]

## 2016-06-06 DIAGNOSIS — Z96643 Presence of artificial hip joint, bilateral: Secondary | ICD-10-CM | POA: Diagnosis not present

## 2016-06-06 DIAGNOSIS — Z7901 Long term (current) use of anticoagulants: Secondary | ICD-10-CM | POA: Diagnosis not present

## 2016-06-06 DIAGNOSIS — A419 Sepsis, unspecified organism: Secondary | ICD-10-CM | POA: Diagnosis present

## 2016-06-06 DIAGNOSIS — M009 Pyogenic arthritis, unspecified: Secondary | ICD-10-CM | POA: Diagnosis not present

## 2016-06-06 DIAGNOSIS — Z79899 Other long term (current) drug therapy: Secondary | ICD-10-CM | POA: Diagnosis not present

## 2016-06-06 MED ORDER — FENTANYL CITRATE (PF) 100 MCG/2ML IJ SOLN
50.0000 ug | Freq: Once | INTRAMUSCULAR | Status: AC
  Administered 2016-06-06: 50 ug via INTRAVENOUS
  Filled 2016-06-06: qty 2

## 2016-06-06 MED ORDER — ACETAMINOPHEN 500 MG PO TABS
1000.0000 mg | ORAL_TABLET | Freq: Once | ORAL | Status: AC
  Administered 2016-06-06: 1000 mg via ORAL
  Filled 2016-06-06: qty 2

## 2016-06-06 NOTE — ED Notes (Signed)
Chad Parks, [facility] on her way

## 2016-06-06 NOTE — ED Notes (Signed)
[facility] here to transport patient to [facility]- Room [phone]

## 2016-06-07 LAB — URINE CULTURE: CULTURE: NO GROWTH

## 2016-06-10 LAB — CULTURE, BLOOD (ROUTINE X 2)
CULTURE: NO GROWTH
Culture: NO GROWTH
SPECIAL REQUESTS: ADEQUATE
Special Requests: ADEQUATE

## 2017-07-15 IMAGING — DX DG CHEST 2V
2 series · 2 of 2 positions shown · non-contrast
Comparison: None.

CLINICAL DATA: Sepsis. Recent total left hip arthroplasty. Fever
last evening.

EXAM:
CHEST  2 VIEW

[chest ap]
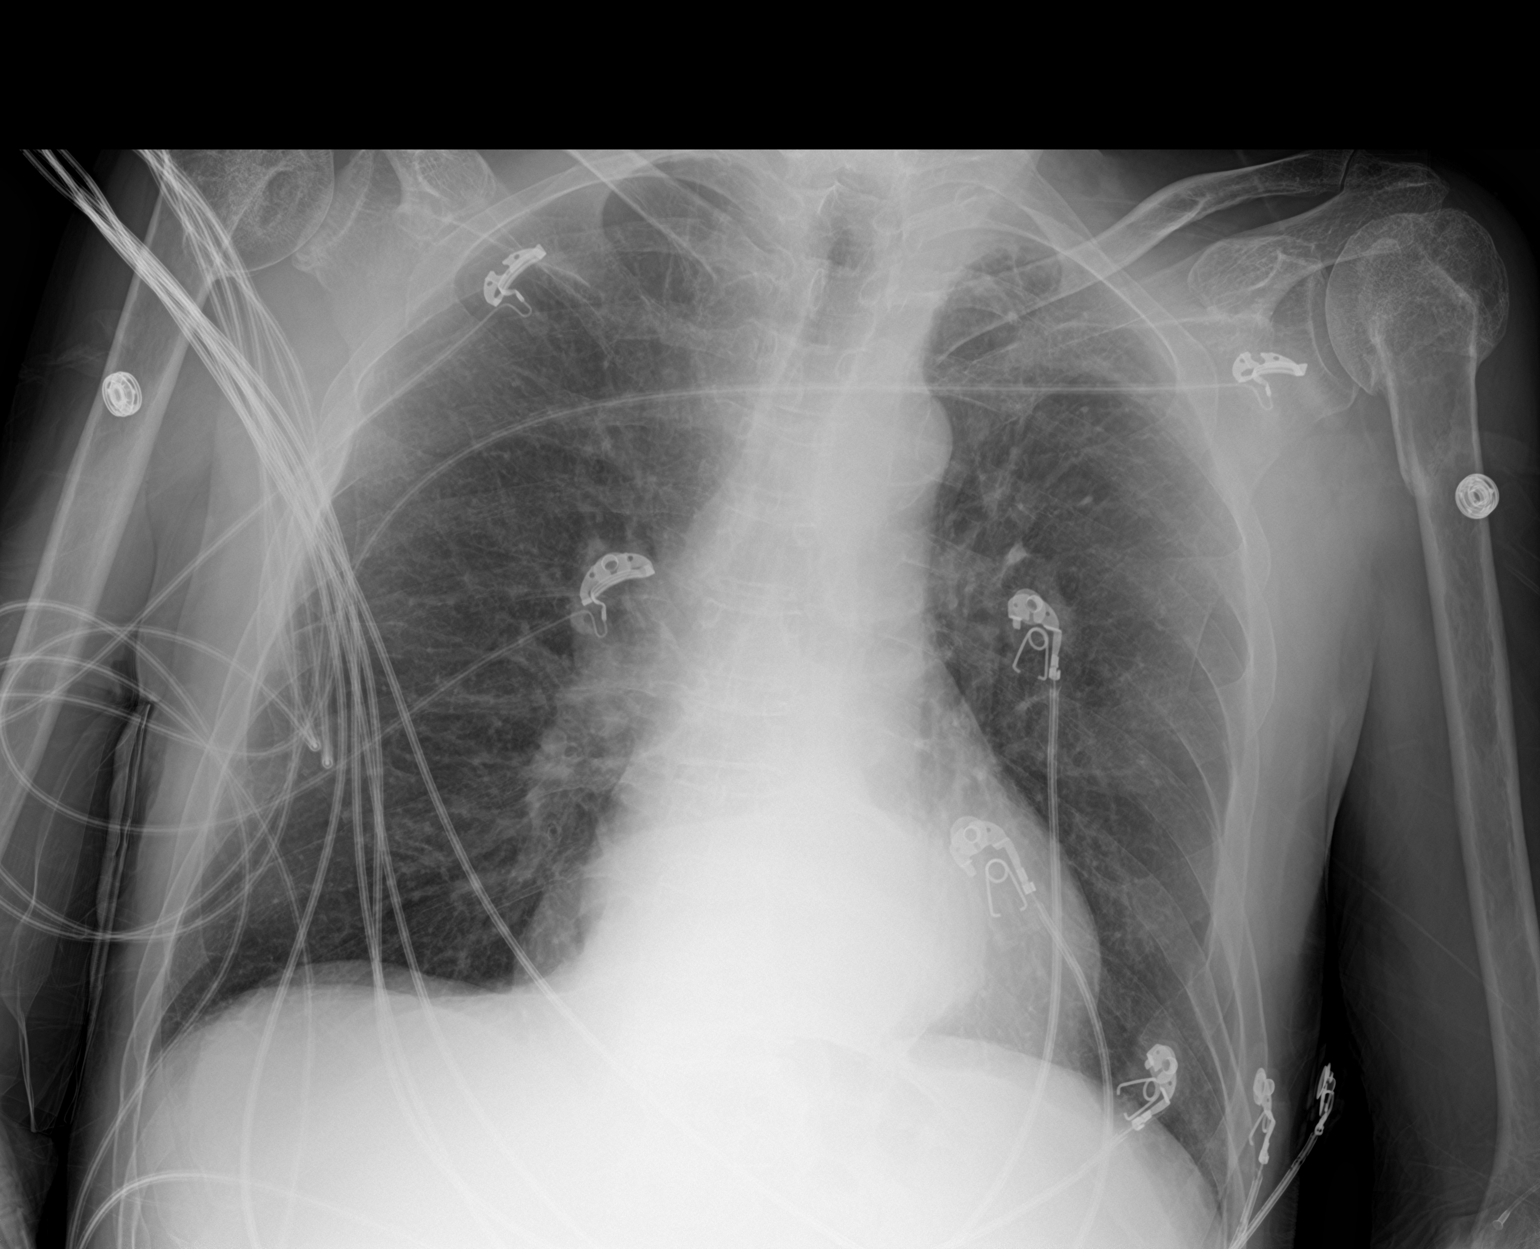

[chest lat]
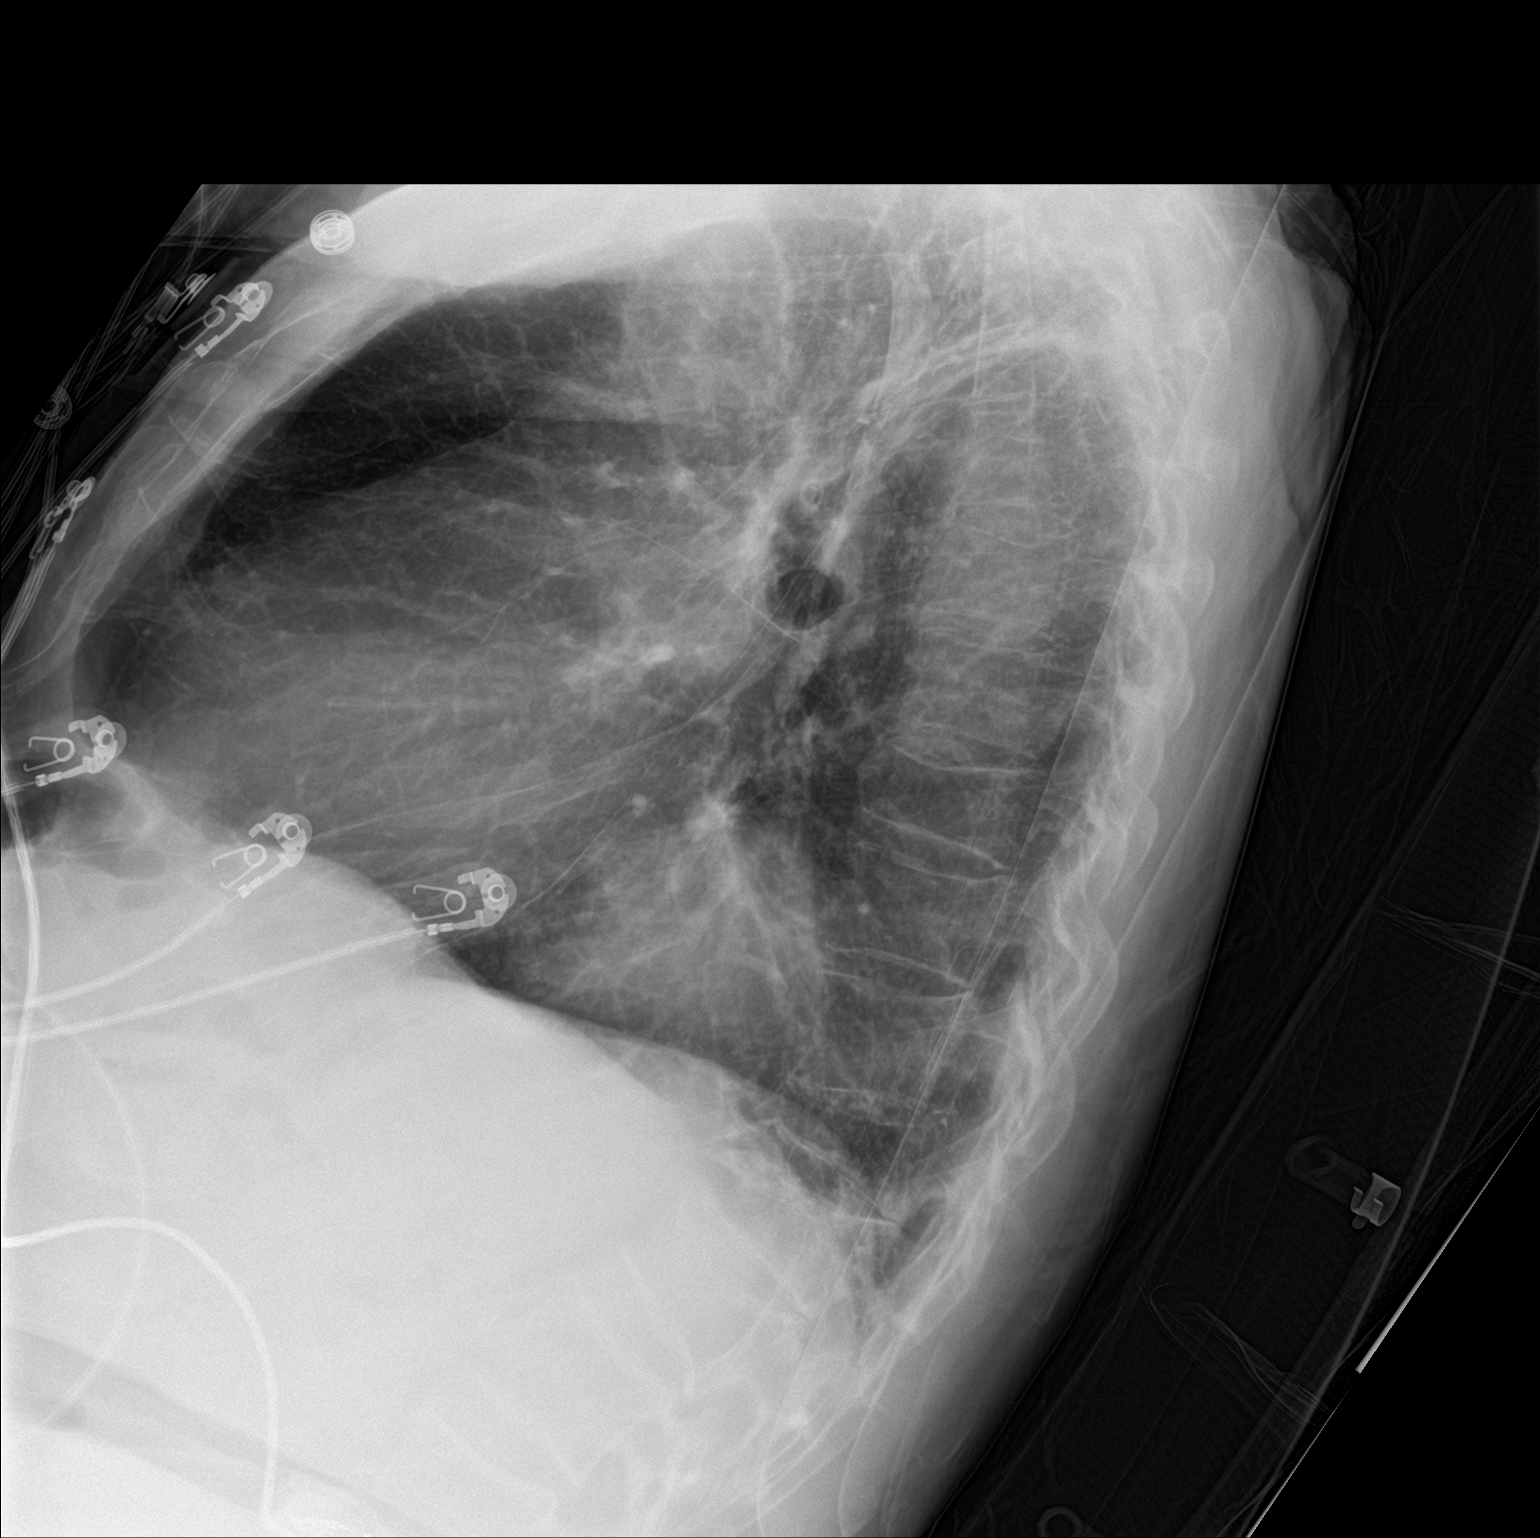

[2 of 2 positions shown; findings below may reference images not displayed]

FINDINGS: There is a moderate-sized hiatal hernia projecting over the top
normal size cardiac silhouette. There is mild aortic
atherosclerosis. Emphysematous hyperinflation of the lungs with left
upper lobe scarring. No pulmonary consolidation Old posttraumatic
deformity with healing of both humeral necks.
IMPRESSION: 1. No pneumonic consolidation.
2. COPD.
3. Moderate-sized hiatal hernia.
4. Aortic atherosclerosis.
5. Bilateral healed fracture deformity of both humeral necks.

## 2017-07-15 IMAGING — DX DG HIP (WITH OR WITHOUT PELVIS) 2-3V*L*
4 series · 4 of 4 positions shown · non-contrast
Comparison: None.

CLINICAL DATA: Left hip pain, sepsis and fever. History of recent
total hip arthroplasty.

EXAM:
DG HIP (WITH OR WITHOUT PELVIS) 2-3V LEFT

[hip ap]
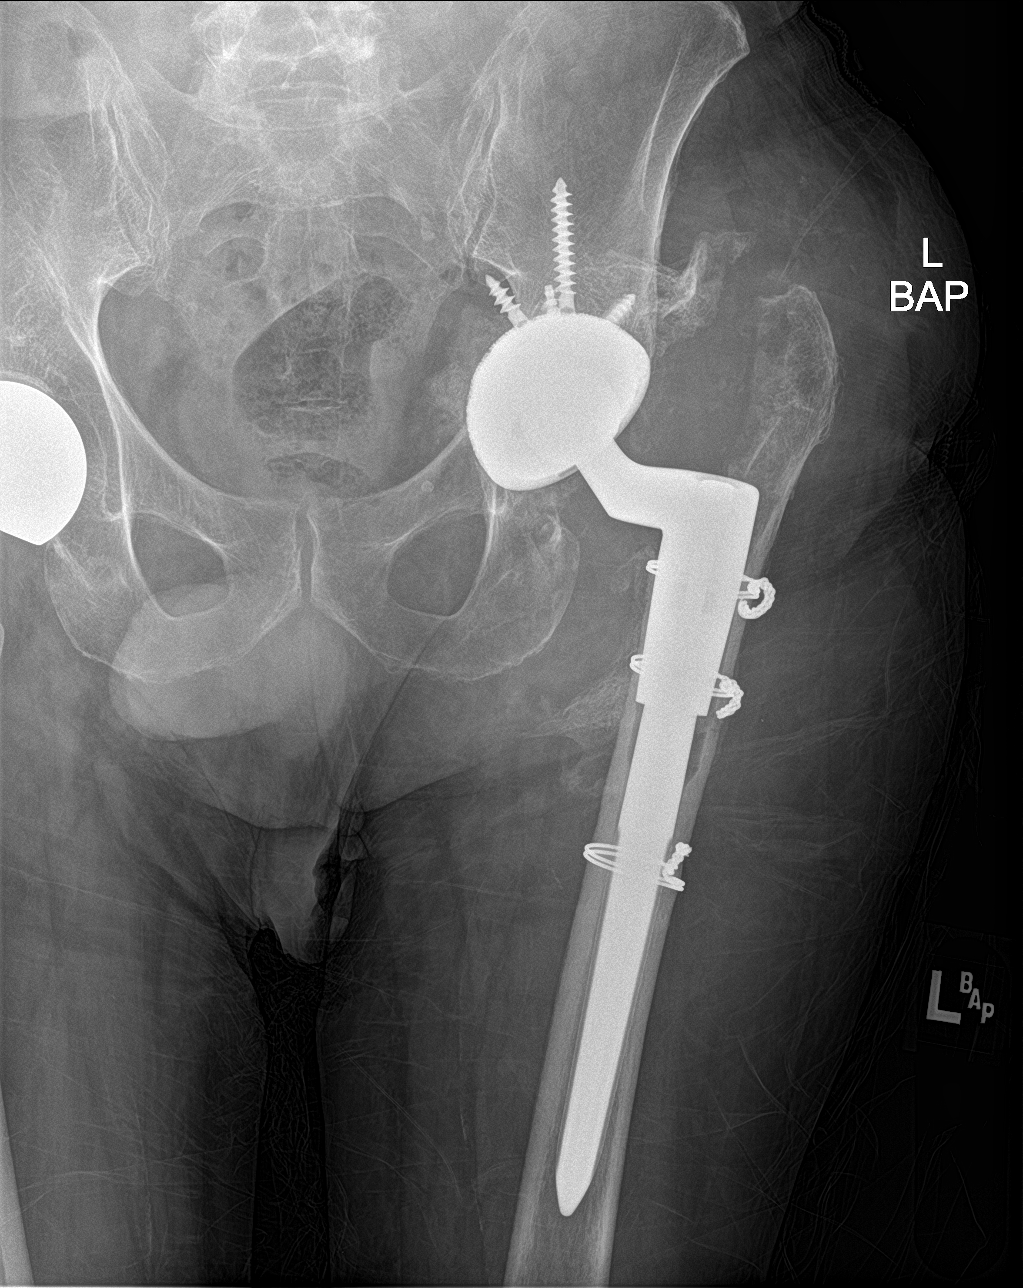

[hip lat (1 of 2)]
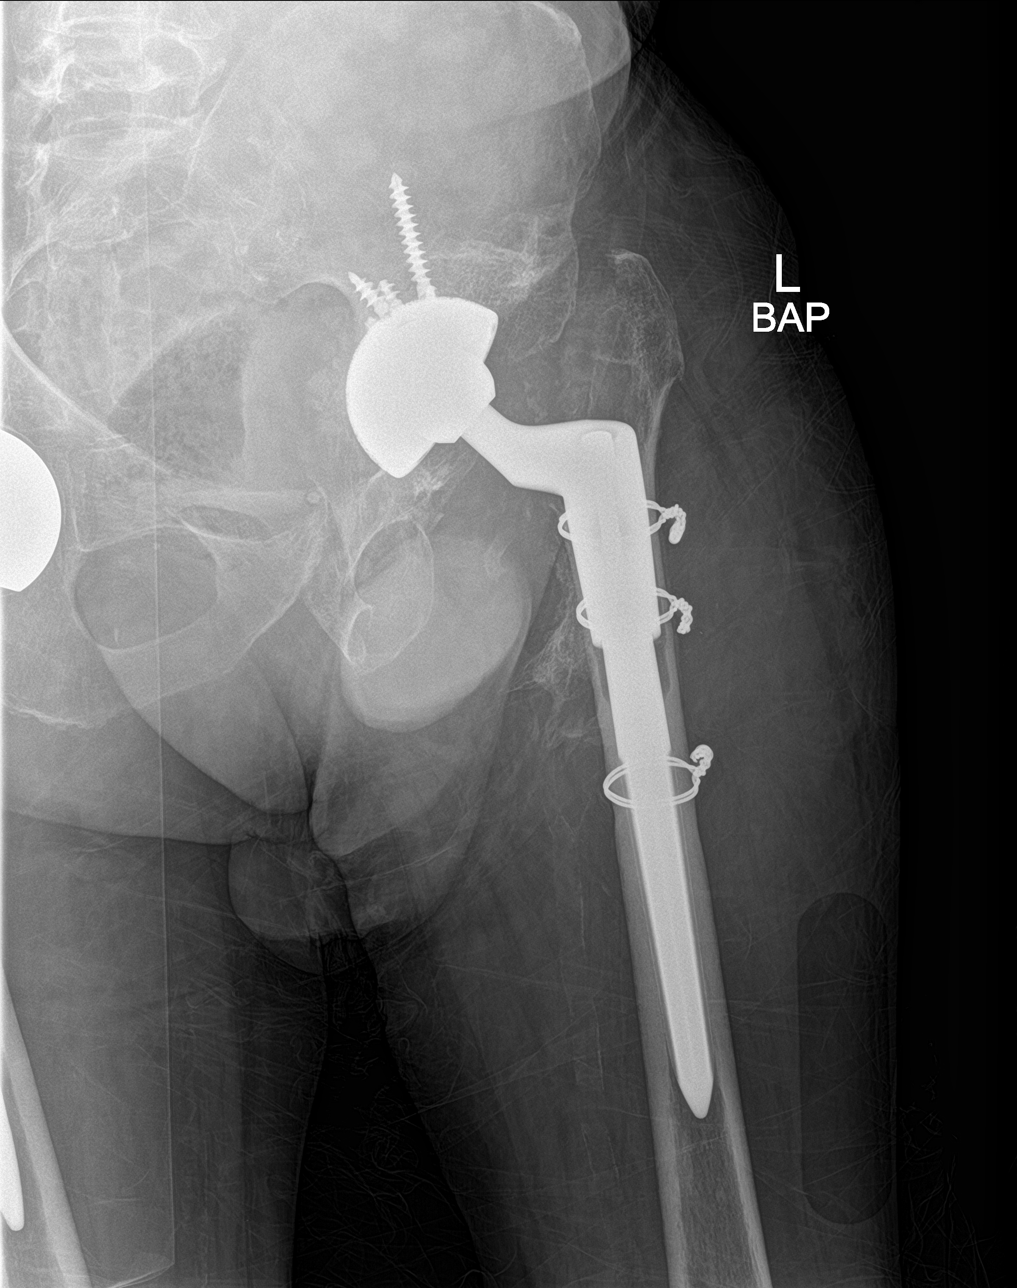

[pelvis ap]
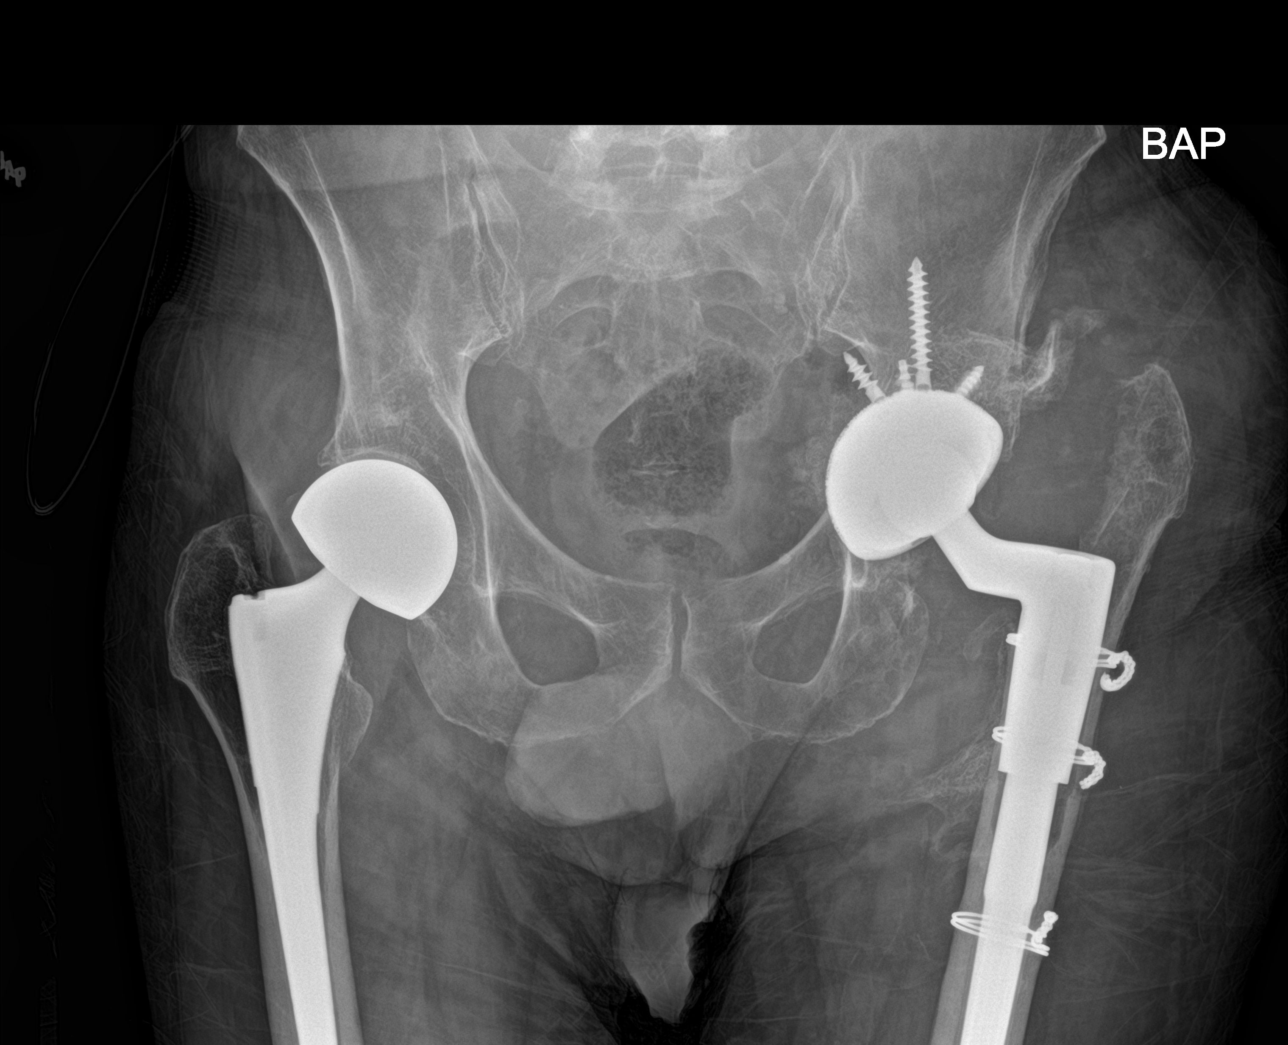

[hip lat (2 of 2)]
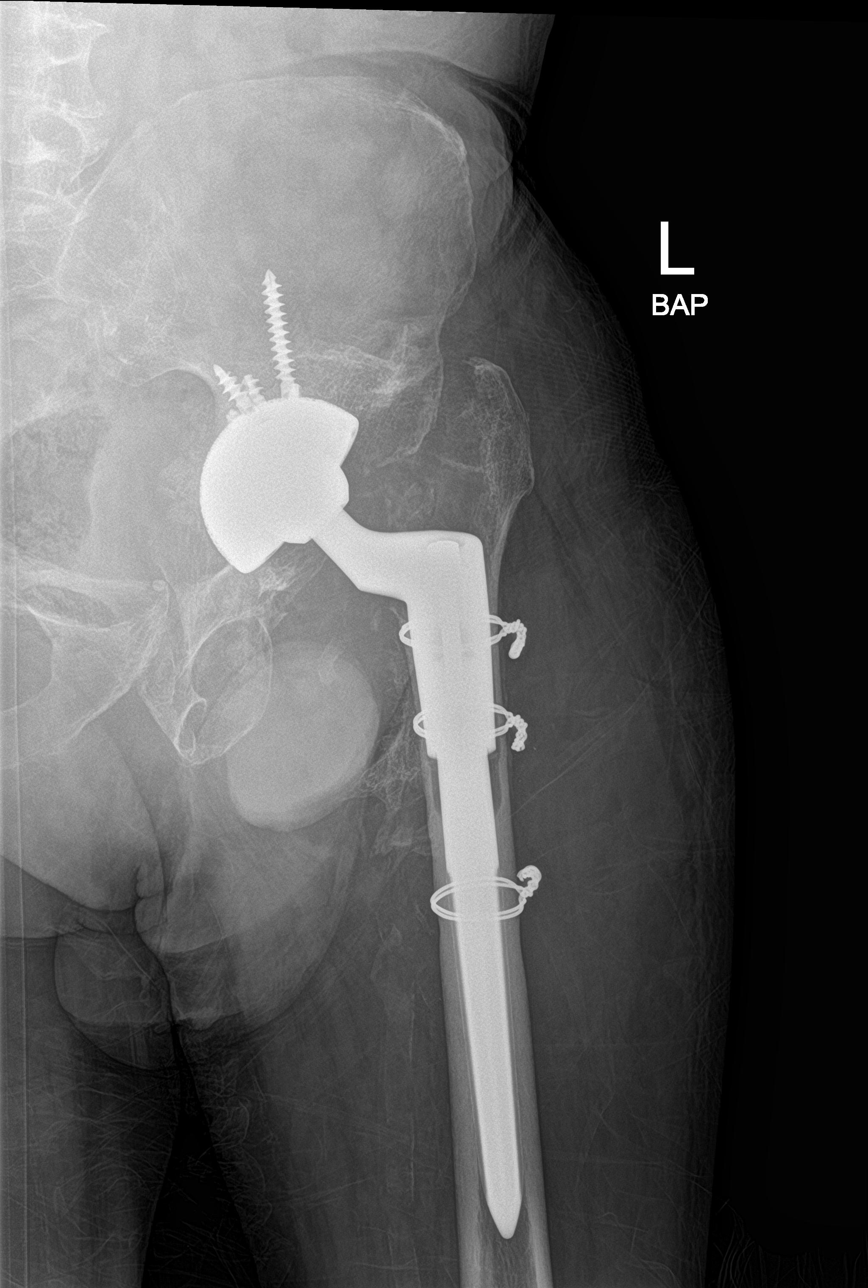

[4 of 4 positions shown; findings below may reference images not displayed]

FINDINGS: Partially visualized right bipolar hip arthroplasty with uncemented
femoral stem. The tip is excluded on this study. There is a left
total hip arthroplasty with uncemented femoral stem. Mild protrusio
of the left acetabular component is seen. No loosening is
identified. Three cerclage wires are noted about the left femoral
shaft and about the femoral component. There soft tissue prominence
about the left hip for which a joint effusion is not entirely
excluded. Heterotopic bone formation is seen about the left hip from
prior arthroplasty.
IMPRESSION: Bilateral hip arthroplasties as above described. Soft tissue
prominence about the left hip cannot exclude a joint effusion.
Consider joint aspiration to exclude a septic joint. No apparent
hardware failure or loosening. No postoperative fracture.

## 2020-01-08 DEATH — deceased
